# Patient Record
Sex: Male | Born: 1995 | Race: White | Hispanic: No | Marital: Single | Smoking: Never smoker
Health system: Southern US, Community
[De-identification: ages and names within clinical notes are randomized; demographics above are authoritative.]

## PROBLEM LIST (undated history)

## (undated) DIAGNOSIS — J45909 Unspecified asthma, uncomplicated: Secondary | ICD-10-CM

## (undated) HISTORY — DX: Unspecified asthma, uncomplicated: J45.909

---
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
## 2017-05-27 ENCOUNTER — Other Ambulatory Visit (HOSPITAL_COMMUNITY)
Admission: RE | Admit: 2017-05-27 | Discharge: 2017-05-27 | Disposition: A | Discharge status: Home / Self Care | Payer: BC Managed Care – PPO | Source: Ambulatory Visit | Attending: Pediatrics | Admitting: Pediatrics

## 2017-05-27 DIAGNOSIS — R197 Diarrhea, unspecified: Secondary | ICD-10-CM | POA: Insufficient documentation

## 2017-05-29 ENCOUNTER — Other Ambulatory Visit
Admission: RE | Admit: 2017-05-29 | Discharge: 2017-05-29 | Disposition: A | Discharge status: Home / Self Care | Payer: BC Managed Care – PPO | Source: Ambulatory Visit | Attending: Pediatrics | Admitting: Pediatrics

## 2017-05-29 DIAGNOSIS — R197 Diarrhea, unspecified: Secondary | ICD-10-CM | POA: Diagnosis not present

## 2017-05-29 LAB — C DIFFICILE QUICK SCREEN W PCR REFLEX
C DIFFICLE (CDIFF) ANTIGEN: NEGATIVE
C Diff interpretation: NOT DETECTED
C Diff toxin: NEGATIVE

## 2017-05-29 LAB — GASTROINTESTINAL PANEL BY PCR, STOOL (REPLACES STOOL CULTURE)
ASTROVIRUS: DETECTED — AB
Adenovirus F40/41: NOT DETECTED
CAMPYLOBACTER SPECIES: NOT DETECTED
CRYPTOSPORIDIUM: NOT DETECTED
CYCLOSPORA CAYETANENSIS: NOT DETECTED
ENTAMOEBA HISTOLYTICA: NOT DETECTED
ENTEROTOXIGENIC E COLI (ETEC): DETECTED — AB
Enteroaggregative E coli (EAEC): NOT DETECTED
Enteropathogenic E coli (EPEC): DETECTED — AB
Giardia lamblia: NOT DETECTED
Norovirus GI/GII: NOT DETECTED
PLESIMONAS SHIGELLOIDES: NOT DETECTED
Rotavirus A: NOT DETECTED
SALMONELLA SPECIES: NOT DETECTED
SAPOVIRUS (I, II, IV, AND V): NOT DETECTED
SHIGA LIKE TOXIN PRODUCING E COLI (STEC): NOT DETECTED
SHIGELLA/ENTEROINVASIVE E COLI (EIEC): NOT DETECTED
VIBRIO SPECIES: NOT DETECTED
Vibrio cholerae: NOT DETECTED
Yersinia enterocolitica: NOT DETECTED

## 2019-01-22 ENCOUNTER — Other Ambulatory Visit: Payer: Self-pay | Admitting: Chiropractor

## 2019-01-22 DIAGNOSIS — S2231XA Fracture of one rib, right side, initial encounter for closed fracture: Secondary | ICD-10-CM

## 2019-01-23 ENCOUNTER — Other Ambulatory Visit: Payer: Self-pay | Admitting: Chiropractor

## 2019-01-23 ENCOUNTER — Ambulatory Visit
Admission: RE | Admit: 2019-01-23 | Discharge: 2019-01-23 | Disposition: A | Discharge status: Home / Self Care | Payer: BC Managed Care – PPO | Source: Ambulatory Visit | Attending: Chiropractor | Admitting: Chiropractor

## 2019-01-23 DIAGNOSIS — R29898 Other symptoms and signs involving the musculoskeletal system: Secondary | ICD-10-CM

## 2019-06-05 ENCOUNTER — Other Ambulatory Visit: Payer: Self-pay

## 2019-06-05 DIAGNOSIS — Z20822 Contact with and (suspected) exposure to covid-19: Secondary | ICD-10-CM

## 2019-06-09 ENCOUNTER — Telehealth: Payer: Self-pay | Admitting: *Deleted

## 2019-06-09 LAB — NOVEL CORONAVIRUS, NAA: SARS-CoV-2, NAA: DETECTED — AB

## 2019-06-09 NOTE — Telephone Encounter (Signed)
Pt's lab results showed, that the covid-19 virus was detected in his swab. Attempted to call patient, no answer left message for him to call back.

## 2019-06-09 NOTE — Telephone Encounter (Signed)
Pt called back for Covid-19 results. Attempted to transfer pt to nurse triage but there was no answer. Pt requests call back. Cb# [phone]

## 2019-06-10 ENCOUNTER — Telehealth: Payer: Self-pay

## 2019-06-10 NOTE — Telephone Encounter (Signed)
Pt. Called back; see TE 06/10/19.

## 2019-06-10 NOTE — Telephone Encounter (Addendum)
Pt returning call for COVID-19 lab result. Pt was informed that he was positive for the COVID-19. He is infected with the novel coronavirus and could pass the germ to others. Pt states that he is having mild symptoms.  Symptom tier reviewed with patient. He verbalized understanding. Reviewed ending self isolation criteria with patient and preventative measures. Information will be faxed to [facility] Department.

## 2019-12-16 ENCOUNTER — Other Ambulatory Visit: Payer: Self-pay

## 2019-12-16 ENCOUNTER — Encounter: Payer: Self-pay | Admitting: Neurology

## 2019-12-16 DIAGNOSIS — R202 Paresthesia of skin: Secondary | ICD-10-CM

## 2019-12-16 DIAGNOSIS — G5602 Carpal tunnel syndrome, left upper limb: Secondary | ICD-10-CM

## 2019-12-30 ENCOUNTER — Ambulatory Visit (INDEPENDENT_AMBULATORY_CARE_PROVIDER_SITE_OTHER): Payer: BC Managed Care – PPO | Admitting: Neurology

## 2019-12-30 ENCOUNTER — Other Ambulatory Visit: Payer: Self-pay

## 2019-12-30 DIAGNOSIS — R202 Paresthesia of skin: Secondary | ICD-10-CM

## 2019-12-30 DIAGNOSIS — G5622 Lesion of ulnar nerve, left upper limb: Secondary | ICD-10-CM

## 2019-12-30 DIAGNOSIS — G5602 Carpal tunnel syndrome, left upper limb: Secondary | ICD-10-CM

## 2019-12-30 NOTE — Procedures (Signed)
[facility]  [address], [address]  [place], [place] [address] Tel: [phone] Fax:  [phone] Test Date:  12/30/2019  Patient: James [name] DOB: [birth year] Physician: Nita Sickle, DO  Sex: Male Height: 5\' 7"  Ref Phys: , MD  ID#: [id number] Gallo Temp: 32.0C Technician:    Patient Complaints: This is a 24 year old man referred for evaluation of left arm pain and tingling.  NCV & EMG Findings: Extensive electrodiagnostic testing of the left upper extremity shows:  1. Left median, ulnar and mixed palmar sensory responses are within normal limits. 2. Left ulnar motor response shows slowed conduction velocity across the elbow (A Elbow-B Elbow, 48 m/s).  Left median motor response is within normal limits.  Of note, there is evidence of a Martin-Gruber anastomosis as seen by a greater proximal median amplitude and a motor response at the ulnar-wrist recording at the abductor pollicis brevis muscle.   3. There is no evidence of active or chronic motor axonal loss changes affecting any of the tested muscles.  Motor unit configuration and recruitment pattern is within normal limits.    Impression: 1. Left ulnar neuropathy with slowing across the elbow, purely demyelinating, mild. 2. 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 3. There is no evidence of carpal tunnel syndrome or cervical radiculopathy affecting the left upper extremity.   ___________________________ 30, DO    Nerve Conduction Studies Anti Sensory Summary Table   Site NR Peak (ms) Norm Peak (ms) P-T Amp (V) Norm P-T Amp  Left Median Anti Sensory (2nd Digit)  32C  Wrist    3.3 <3.3 38.0 >20  Left Ulnar Anti Sensory (5th Digit)  32C  Wrist    3.0 <3.0 47.8 >18   Motor Summary Table   Site NR Onset (ms) Norm Onset (ms) O-P Amp (mV) Norm O-P Amp Site1 Site2 Delta-0 (ms) Dist (cm) Vel (m/s) Norm Vel (m/s)  Left Median Motor (Abd Poll Brev)   32C  Wrist    3.6 <3.9 9.2 >6 Elbow Wrist 5.2 28.0 54 >51  Elbow    8.8  11.2  Ulnar-wrist crossover Elbow 3.8 0.0    Ulnar-wrist crossover    5.0  5.9         Left Ulnar Motor (Abd Dig Minimi)  32C  Wrist    2.7 <3.0 12.9 >8 B Elbow Wrist 3.5 22.0 63 >51  B Elbow    6.2  11.9  A Elbow B Elbow 2.1 10.0 48 >51  A Elbow    8.3  10.8          Comparison Summary Table   Site NR Peak (ms) Norm Peak (ms) P-T Amp (V) Site1 Site2 Delta-P (ms) Norm Delta (ms)  Left Median/Ulnar Palm Comparison (Wrist - 8cm)  32C  Median Palm    1.8 <2.2 39.7 Median Palm Ulnar Palm 0.1   Ulnar Palm    1.9 <2.2 37.2       EMG   Side Muscle Ins Act Fibs Psw Fasc Number Recrt Dur Dur. Amp Amp. Poly Poly. Comment  Left 1stDorInt Nml Nml Nml Nml Nml Nml Nml Nml Nml Nml Nml Nml N/A  Left Ext Indicis Nml Nml Nml Nml Nml Nml Nml Nml Nml Nml Nml Nml N/A  Left PronatorTeres Nml Nml Nml Nml Nml Nml Nml Nml Nml Nml Nml Nml N/A  Left Biceps Nml Nml Nml Nml Nml Nml Nml Nml Nml Nml Nml Nml N/A  Left Triceps Nml Nml  Nml Nml Nml Nml Nml Nml Nml Nml Nml Nml N/A  Left Deltoid Nml Nml Nml Nml Nml Nml Nml Nml Nml Nml Nml Nml N/A  Left ABD Dig Min Nml Nml Nml Nml Nml Nml Nml Nml Nml Nml Nml Nml N/A  Left FlexCarpiUln Nml Nml Nml Nml Nml Nml Nml Nml Nml Nml Nml Nml N/A      Waveforms:

## 2020-09-16 ENCOUNTER — Other Ambulatory Visit: Payer: Self-pay

## 2020-09-16 DIAGNOSIS — R202 Paresthesia of skin: Secondary | ICD-10-CM

## 2020-11-02 ENCOUNTER — Encounter: Payer: BC Managed Care – PPO | Admitting: Neurology
